# Patient Record
Sex: Male | Born: 1985 | Race: White | Hispanic: No | Marital: Single | Smoking: Never smoker
Health system: Southern US, Community
[De-identification: ages and names within clinical notes are randomized; demographics above are authoritative.]

## PROBLEM LIST (undated history)

## (undated) DIAGNOSIS — T7840XA Allergy, unspecified, initial encounter: Secondary | ICD-10-CM

## (undated) HISTORY — DX: Allergy, unspecified, initial encounter: T78.40XA

---
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
## 2012-09-15 ENCOUNTER — Ambulatory Visit (INDEPENDENT_AMBULATORY_CARE_PROVIDER_SITE_OTHER): Payer: No Typology Code available for payment source | Admitting: Family Medicine

## 2012-09-15 ENCOUNTER — Ambulatory Visit: Payer: No Typology Code available for payment source

## 2012-09-15 VITALS — BP 126/80 | HR 63 | Temp 98.7°F | Resp 18 | Ht 76.5 in | Wt 205.4 lb

## 2012-09-15 DIAGNOSIS — S99912A Unspecified injury of left ankle, initial encounter: Secondary | ICD-10-CM

## 2012-09-15 DIAGNOSIS — S99922A Unspecified injury of left foot, initial encounter: Secondary | ICD-10-CM

## 2012-09-15 DIAGNOSIS — S8990XA Unspecified injury of unspecified lower leg, initial encounter: Secondary | ICD-10-CM

## 2012-09-15 DIAGNOSIS — M79609 Pain in unspecified limb: Secondary | ICD-10-CM

## 2012-09-15 NOTE — Progress Notes (Signed)
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 He does have to travel some. He comes in tonight with his family because he had a inversion ankle injury to left ankle. This happened on Saturday. Patient had ankle sprains before but this is much worse.  Objective: Diffuse swelling about the entire left ankle with marked ecchymosis on the lateral interface between sole and foot. Is mildly tender over both medial malleolus and behind the lateral malleolus. The swelling extends throughout the foot and up the distal tibia region.  [facility] reading (PRIMARY) by  Dr. Milus Glazier:  Left ankle. No fracture seen  Assessment:  Severe ankle sprain, left  Plan:  Cam Walker, NWB x 3 days then progressive weight bearing.  Recheck 3 weeks  Signed, Mosie Epstein, MD

## 2014-06-08 ENCOUNTER — Encounter: Payer: Self-pay | Admitting: Physician Assistant

## 2014-06-08 ENCOUNTER — Ambulatory Visit (INDEPENDENT_AMBULATORY_CARE_PROVIDER_SITE_OTHER): Payer: BLUE CROSS/BLUE SHIELD | Admitting: Physician Assistant

## 2014-06-08 VITALS — BP 136/77 | HR 96 | Temp 102.4°F | Resp 18 | Ht 77.0 in | Wt 218.0 lb

## 2014-06-08 DIAGNOSIS — R6889 Other general symptoms and signs: Secondary | ICD-10-CM | POA: Diagnosis not present

## 2014-06-08 MED ORDER — OSELTAMIVIR PHOSPHATE 75 MG PO CAPS: 75.0000 mg | ORAL_CAPSULE | Freq: Two times a day (BID) | ORAL | Status: AC

## 2014-06-08 NOTE — Patient Instructions (Signed)
You most likely have the flu based on your symptoms.  Be sure to drink plenty of fluids, get plenty of rest, take tylenol as needed for aches and pains, take the tamiflu twice daily for the next 5 days. This should shorten your symptoms by about one day. Let us know if you're not feeling better by the weekend.

## 2014-06-08 NOTE — Progress Notes (Signed)
   Subjective:    Patient ID: Austin MccallumBrian Underwood, male    DOB: Jul 12, 1985, 29 y.o.   MRN: [id number]  Chief Complaint  Patient presents with  . Fever    Last night  . Chills    X last night  . Generalized Body Aches    X today  . Emesis    Once this morning   There are no active problems to display for this patient.  Medications, allergies, past medical history, surgical history, family history, social history and problem list reviewed and updated.  HPI  29 yom presents with fever, chills, body aches, emesis.   Sx started suddenly last night. Started with chills, body aches. Temp 101.4 last night. Nauseated whole night. One episode non bloody emesis this am. Generalized body aches. Mild cough today. Denies abd pain, diarrhea, st, otalgia, sob, cp.   No flu vaccine this year.   Review of Systems See HPI.     Objective:   Physical Exam  Constitutional: He appears well-developed and well-nourished.  Non-toxic appearance. He does not have a sickly appearance. He does not appear ill. No distress.  BP 136/77 mmHg  Pulse 96  Temp(Src) 102.4 F (39.1 C) (Oral)  Resp 18  Ht 6\' 5"  (1.956 m)  Wt 218 lb (98.884 kg)  BMI 25.85 kg/m2  SpO2 99%   HENT:  Nose: Mucosal edema and rhinorrhea present. Right sinus exhibits no maxillary sinus tenderness and no frontal sinus tenderness. Left sinus exhibits no maxillary sinus tenderness and no frontal sinus tenderness.  Mouth/Throat: Uvula is midline and oropharynx is clear and moist.  Pulmonary/Chest: Effort normal and breath sounds normal. No tachypnea.  Lymphadenopathy:       Head (right side): No submental, no submandibular and no tonsillar adenopathy present.       Head (left side): No submental, no submandibular and no tonsillar adenopathy present.    He has no cervical adenopathy.      Assessment & Plan:   [age] yom presents with fever, chills, body aches, emesis.   Flu-like symptoms - Plan: oseltamivir (TAMIFLU) 75 MG capsule --flu-like  sx with sudden onset chills, fever today in clinic, body aches --tamiflu, rest, fluids, tylenol prn, off work 3 days --rtc if not improved by this weekend  Donnajean Lopesodd M. Modesty Rudy, PA-C Physician Assistant-Certified [facility]  06/08/2014 5:17 PM

## 2014-06-09 ENCOUNTER — Telehealth: Payer: Self-pay | Admitting: Physician Assistant

## 2014-06-09 NOTE — Progress Notes (Signed)
Please call to document regarding tick exposure. Also document whether he is to be checked in 24-48 hours to rule out tick disease.

## 2014-06-09 NOTE — Telephone Encounter (Signed)
Attempted to contact pt to determine possible tick exposure prior to his presentation to us. No answer. Unable to leave message. Will attempt call back later.

## 2014-06-09 NOTE — Telephone Encounter (Signed)
Opened in error

## 2014-08-26 IMAGING — CR DG ANKLE COMPLETE 3+V*L*
1 series · 1 of 1 positions shown · non-contrast
Comparison: None.

CLINICAL DATA: Twisting ankle injury.  Pain.

LEFT ANKLE COMPLETE - 3+ VIEW

[AP]
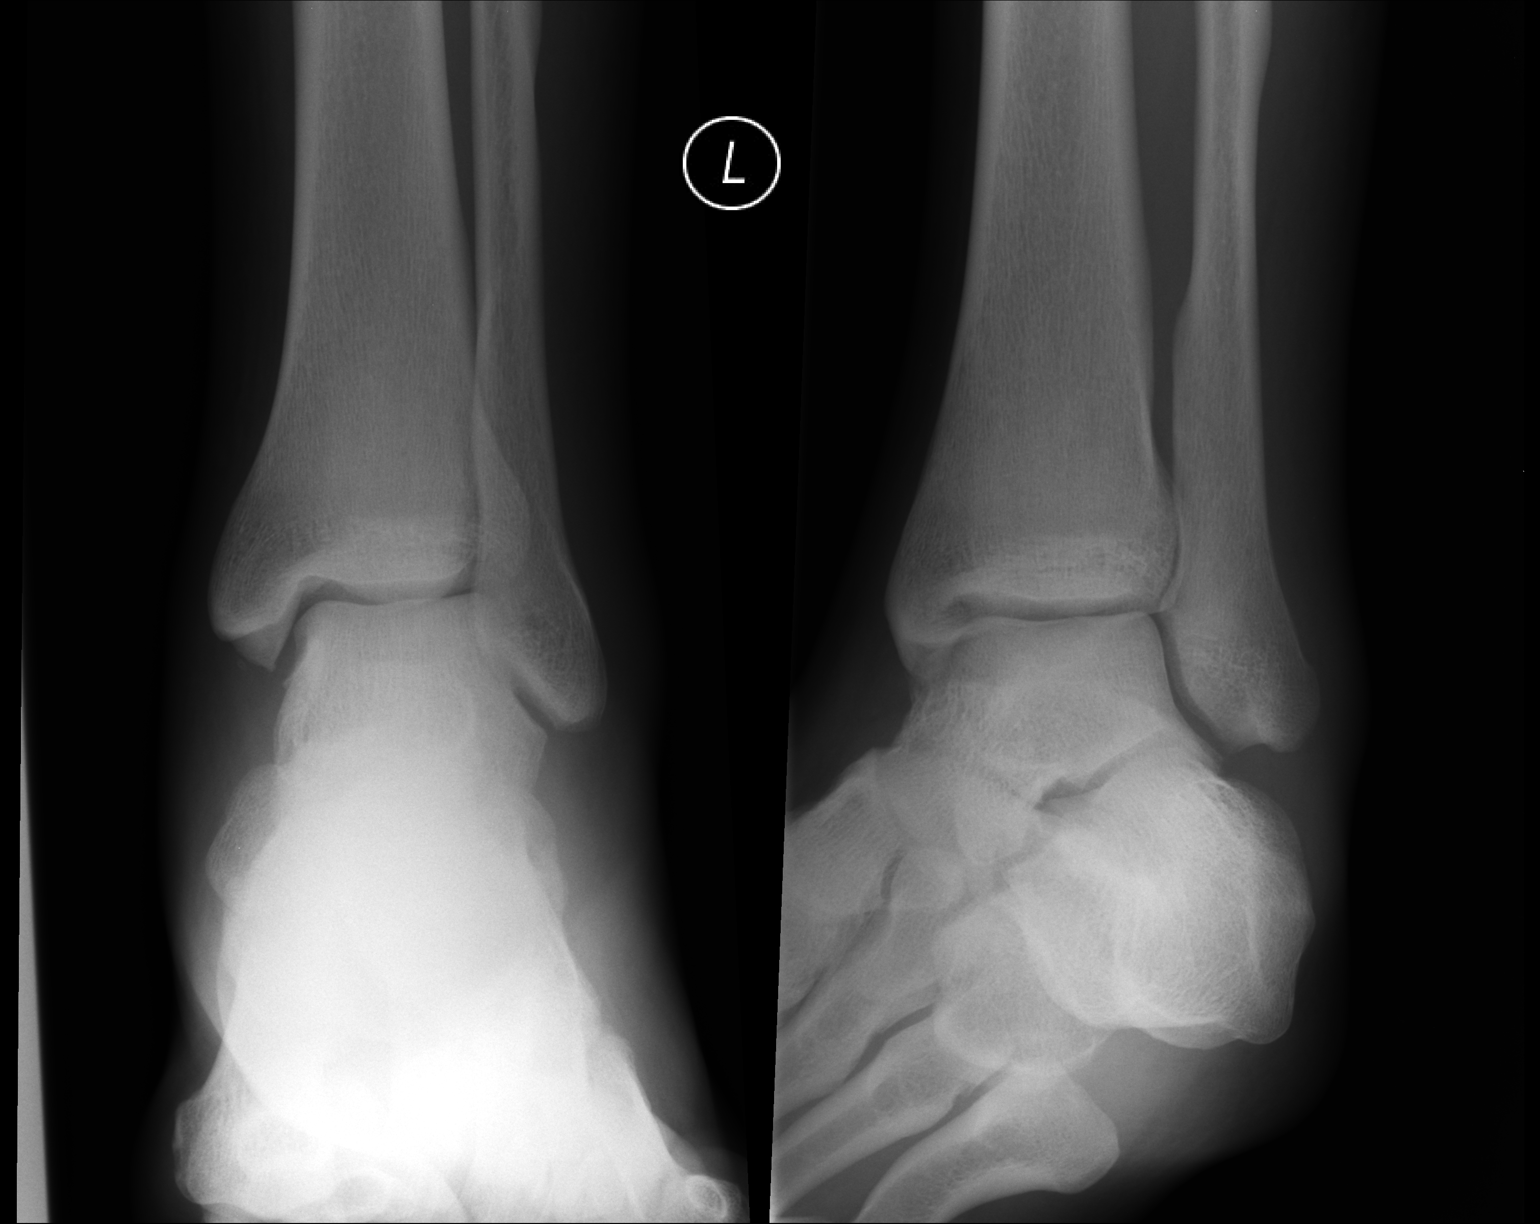

[1 of 1 positions shown; findings below may reference images not displayed]

FINDINGS: No fracture.  The ankle mortise is normally spaced and
aligned.  There is diffuse soft tissue swelling.
IMPRESSION: No fracture or dislocation.

Clinically significant discrepancy from primary report, if
provided: None
